# Patient Record
Sex: Male | Born: 1980 | Race: Black or African American | Hispanic: No | Marital: Single | Smoking: Never smoker
Health system: Southern US, Community
[De-identification: ages and names within clinical notes are randomized; demographics above are authoritative.]

## PROBLEM LIST (undated history)

## (undated) HISTORY — PX: ACHILLES TENDON SURGERY: SHX542

---

## 2003-02-18 ENCOUNTER — Emergency Department (HOSPITAL_COMMUNITY): Admission: EM | Admit: 2003-02-18 | Discharge: 2003-02-18 | Payer: Self-pay | Admitting: Emergency Medicine

## 2003-02-18 ENCOUNTER — Encounter: Payer: Self-pay | Admitting: Emergency Medicine

## 2003-06-02 ENCOUNTER — Emergency Department (HOSPITAL_COMMUNITY): Admission: EM | Admit: 2003-06-02 | Discharge: 2003-06-02 | Payer: Self-pay | Admitting: Emergency Medicine

## 2004-05-23 ENCOUNTER — Emergency Department (HOSPITAL_COMMUNITY): Admission: EM | Admit: 2004-05-23 | Discharge: 2004-05-24 | Payer: Self-pay | Admitting: *Deleted

## 2006-03-22 IMAGING — CR DG ANKLE COMPLETE 3+V*L*
3 series · 3 of 3 positions shown · non-contrast
Comparison: none

CLINICAL DATA: Twisted ankle with persistent pain.  
 LEFT ANKLE (THREE VIEWS)
 No evidence of fracture or dislocation.  Old healed distal tibial fracture is evident.  
 IMPRESSION
 1.  No acute finding.

[view not recorded (1 of 3)]
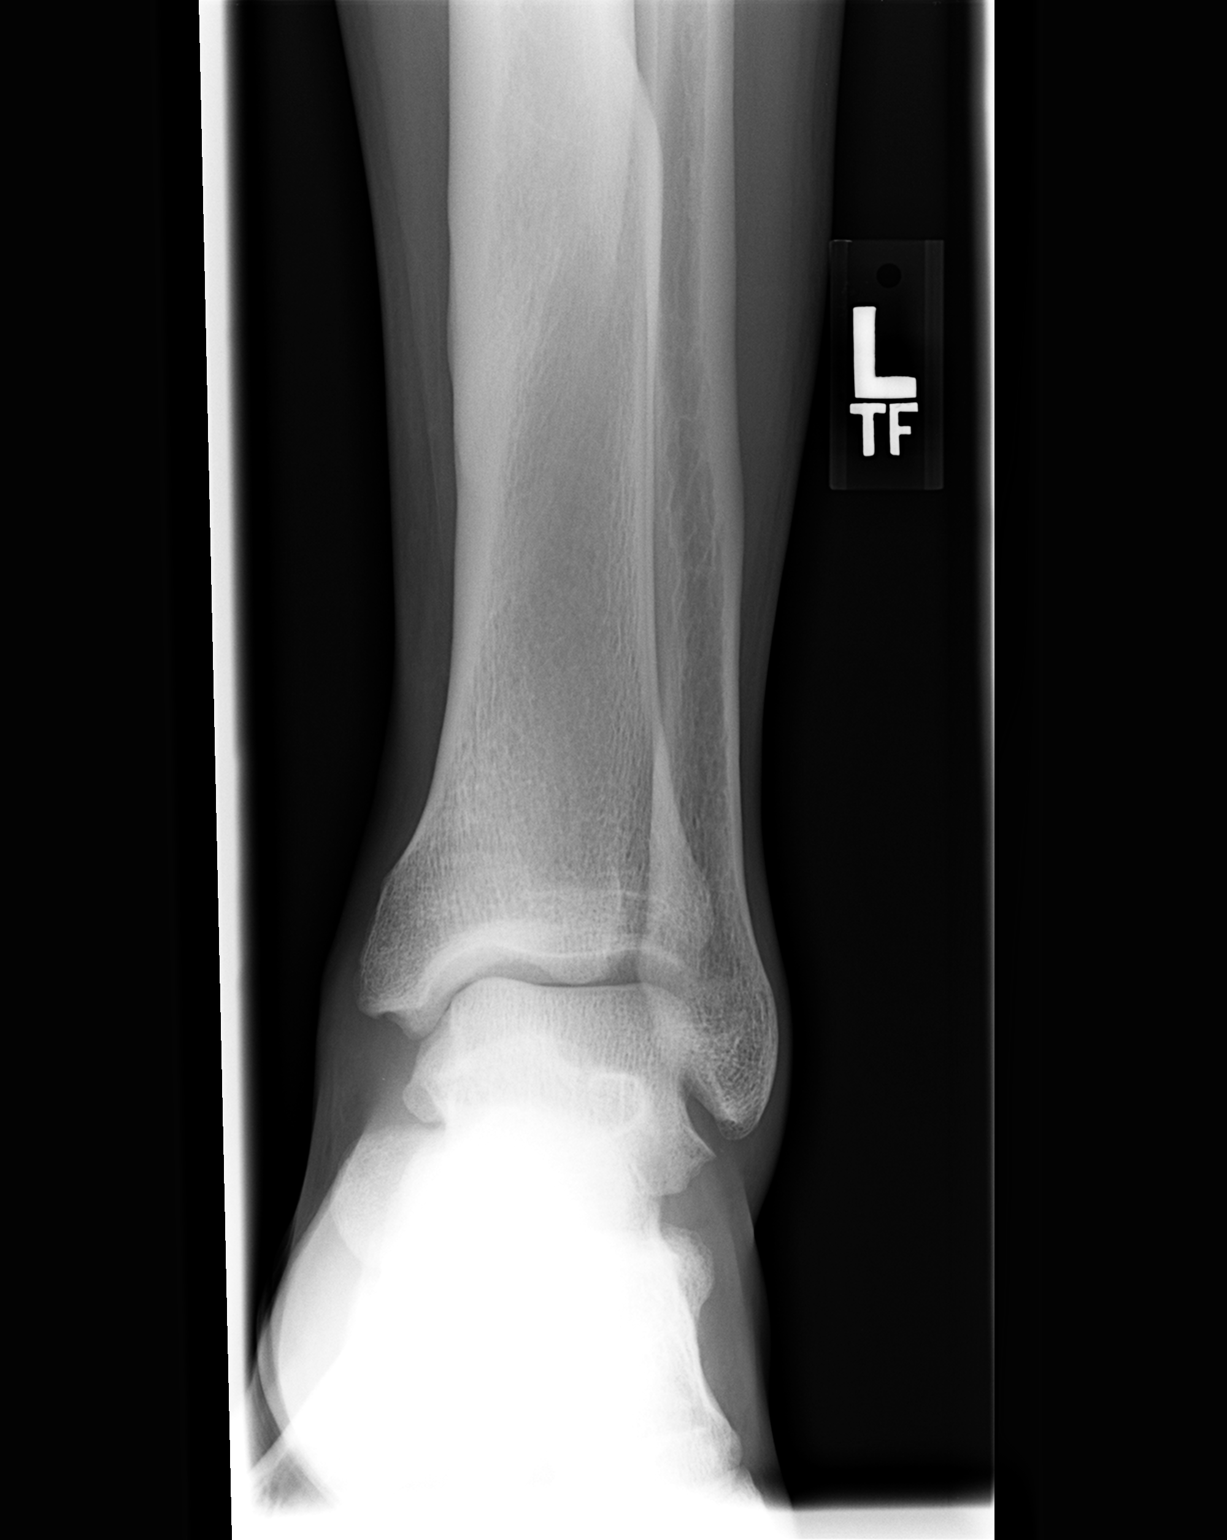

[view not recorded (2 of 3)]
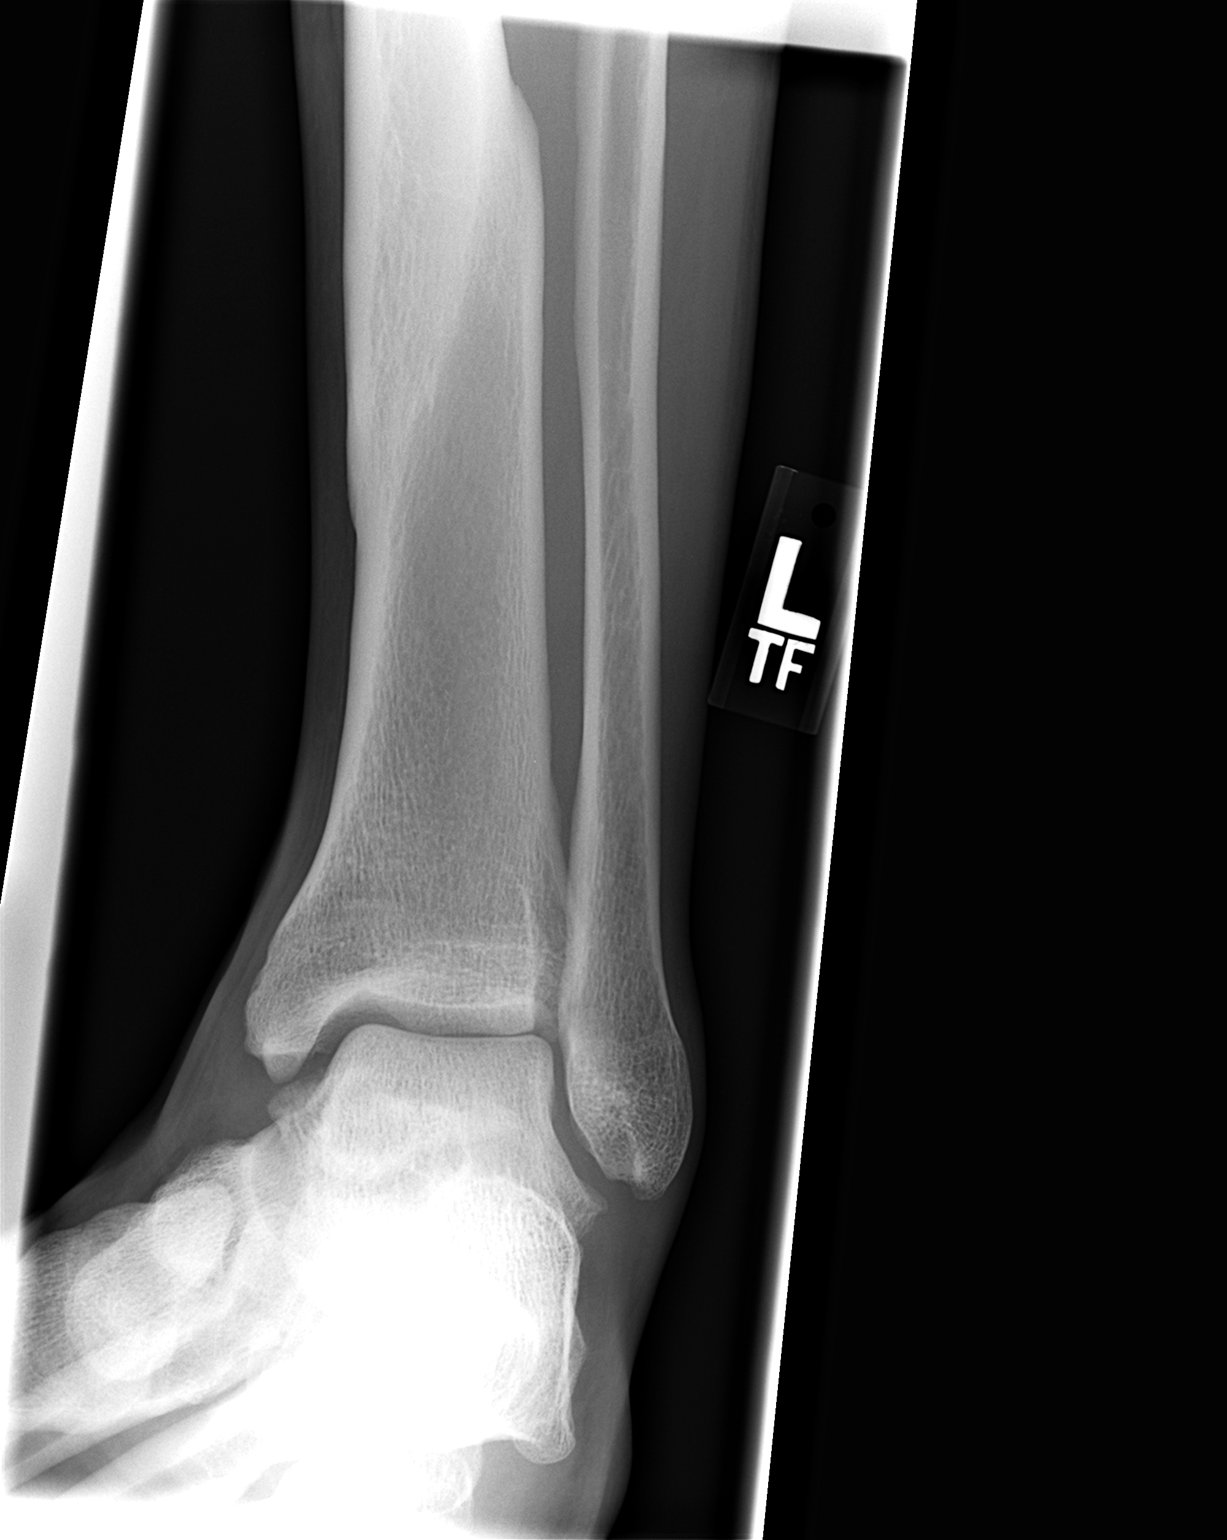

[view not recorded (3 of 3)]
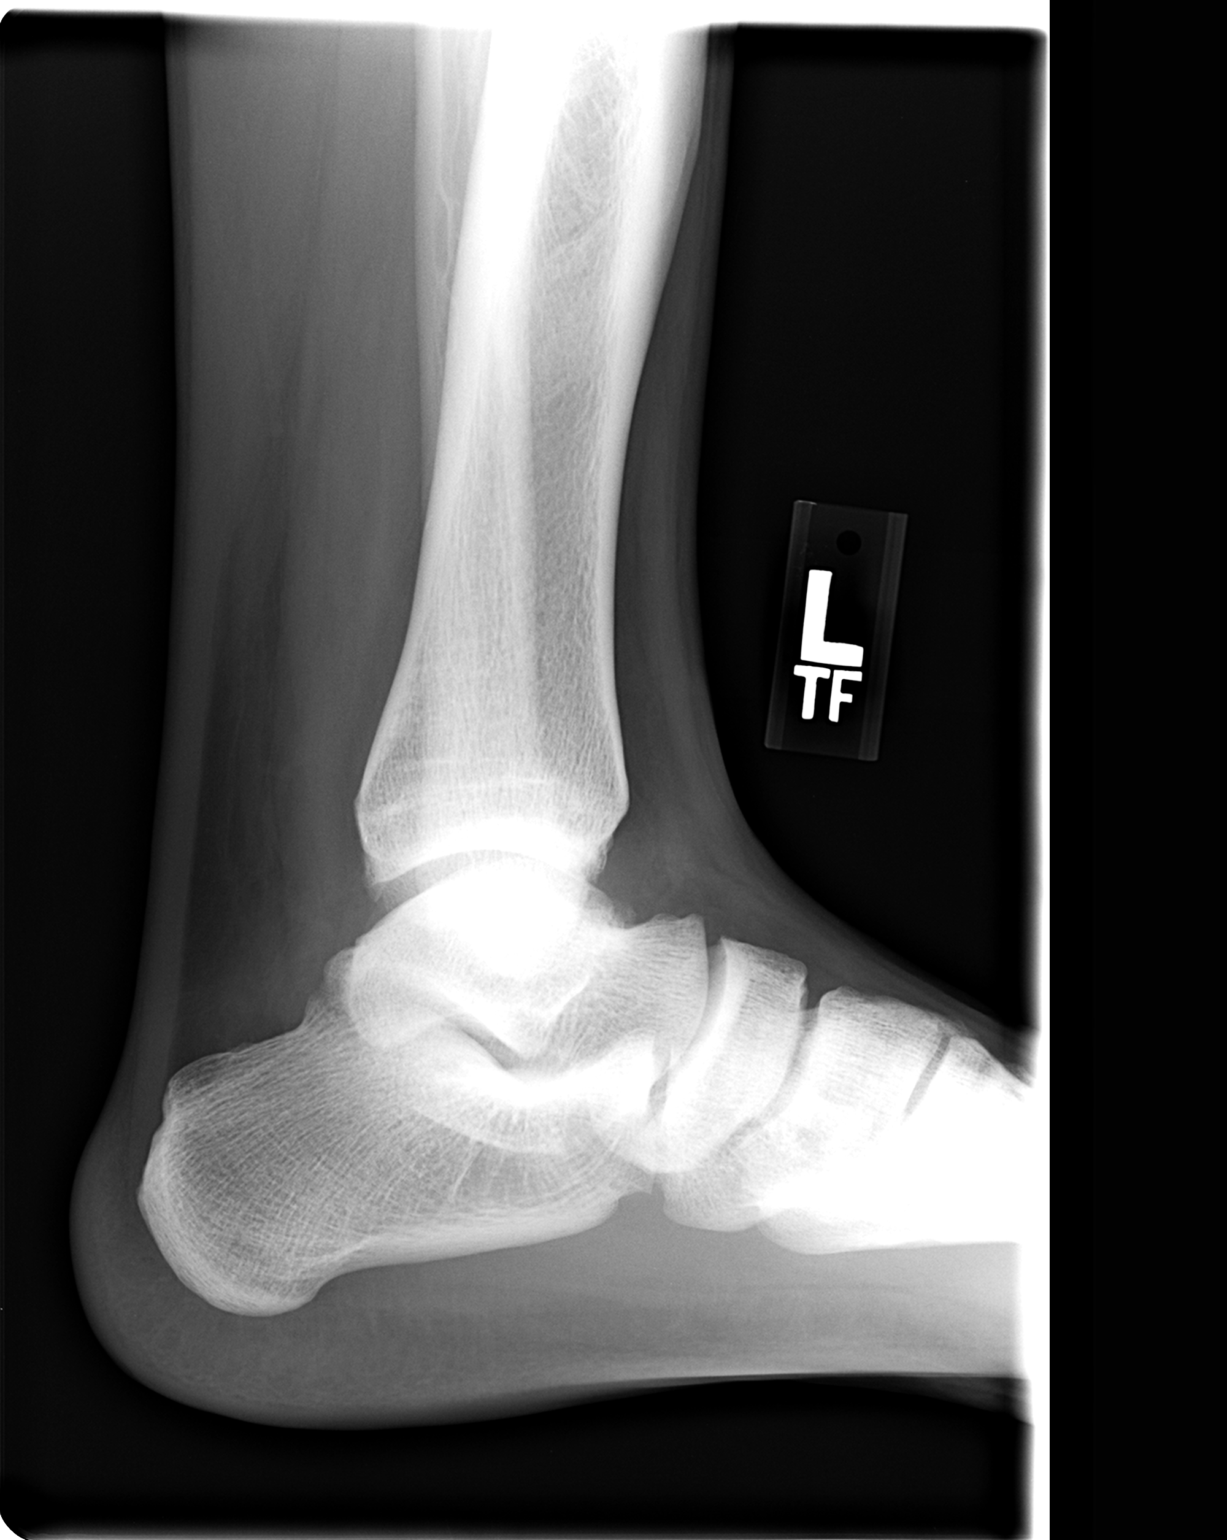

[3 of 3 positions shown; findings below may reference images not displayed]

## 2019-12-09 ENCOUNTER — Ambulatory Visit: Payer: Self-pay | Attending: Internal Medicine

## 2019-12-09 DIAGNOSIS — Z23 Encounter for immunization: Secondary | ICD-10-CM

## 2019-12-09 NOTE — Progress Notes (Signed)
   Covid-19 Vaccination Clinic  Name:  Jared Howe    MRN: [id number] DOB: [birth year]  12/09/2019  Jared Howe was observed post Covid-19 immunization for 15 minutes without incident. He was provided with Vaccine Information Sheet and instruction to access the V-Safe system.   Jared Howe was instructed to call 911 with any severe reactions post vaccine: Marland Kitchen Difficulty breathing  . Swelling of face and throat  . A fast heartbeat  . A bad rash all over body  . Dizziness and weakness   Immunizations Administered    Name Date Dose VIS Date Route   Pfizer COVID-19 Vaccine 12/09/2019  1:57 PM 0.3 mL 09/08/2019 Intramuscular   Manufacturer: [employer], [employer]   Lot: [id number]   NDC: [address]-3

## 2020-01-01 ENCOUNTER — Ambulatory Visit: Payer: Self-pay | Attending: Internal Medicine

## 2020-01-01 DIAGNOSIS — Z23 Encounter for immunization: Secondary | ICD-10-CM

## 2020-01-01 NOTE — Progress Notes (Signed)
   Covid-19 Vaccination Clinic  Name:  MANUELITO POAGE    MRN: [id number] DOB: Feb 10, 1981  01/01/2020  Mr. Tippin was observed post Covid-19 immunization for 15 minutes without incident. He was provided with Vaccine Information Sheet and instruction to access the V-Safe system.   Mr. Verrilli was instructed to call 911 with any severe reactions post vaccine: Marland Kitchen Difficulty breathing  . Swelling of face and throat  . A fast heartbeat  . A bad rash all over body  . Dizziness and weakness   Immunizations Administered    Name Date Dose VIS Date Route   Pfizer COVID-19 Vaccine 01/01/2020  5:04 PM 0.3 mL 09/08/2019 Intramuscular   Manufacturer: [employer], [employer]   Lot: [id number]   NDC: [address]-0

## 2020-01-02 ENCOUNTER — Ambulatory Visit: Payer: Self-pay

## 2020-07-02 ENCOUNTER — Ambulatory Visit
Admission: EM | Admit: 2020-07-02 | Discharge: 2020-07-02 | Disposition: A | Discharge status: Home / Self Care | Payer: PRIVATE HEALTH INSURANCE

## 2020-07-02 ENCOUNTER — Other Ambulatory Visit: Payer: Self-pay

## 2020-07-02 ENCOUNTER — Encounter: Payer: Self-pay | Admitting: Emergency Medicine

## 2020-07-02 DIAGNOSIS — L84 Corns and callosities: Secondary | ICD-10-CM | POA: Diagnosis not present

## 2020-07-02 NOTE — Discharge Instructions (Addendum)
Soak your hand or foot in warm soapy water to soften callus This can make it easier to remove the thickened skin. May apply freeze of compound from [facility] Follow-up with PCP Return or go to ED for worsening of symptoms

## 2020-07-02 NOTE — ED Provider Notes (Signed)
[facility]   [id number] 07/02/20 Arrival Time: 1307   Chief Complaint  Patient presents with  . Foot Pain     SUBJECTIVE: History from: patient.  JDYN PARKERSON is a 39 y.o. male who presented to the urgent care with a complaint of right second and third toe callus for the past few weeks.  Denies any precipitating event.  He localizes the pain to the the right second and third toe.  He describes the pain as constant and achy.  He has tried OTC medications without relief.  His symptoms are made worse with ROM.  He denies similar symptoms in the past.  Denies chills, fever, nausea, vomiting, diarrhea   ROS: As per HPI.  All other pertinent ROS negative.     History reviewed. No pertinent past medical history. Past Surgical History:  Procedure Laterality Date  . ACHILLES TENDON SURGERY Right    No Known Allergies No current facility-administered medications on file prior to encounter.   No current outpatient medications on file prior to encounter.   Social History   Socioeconomic History  . Marital status: Single    Spouse name: Not on file  . Number of children: Not on file  . Years of education: Not on file  . Highest education level: Not on file  Occupational History  . Not on file  Tobacco Use  . Smoking status: Never Smoker  . Smokeless tobacco: Never Used  Substance and Sexual Activity  . Alcohol use: Yes    Comment: occ  . Drug use: Never  . Sexual activity: Not on file  Other Topics Concern  . Not on file  Social History Narrative  . Not on file   Social Determinants of Health   Financial Resource Strain:   . Difficulty of Paying Living Expenses: Not on file  Food Insecurity:   . Worried About Programme researcher, broadcasting/film/video in the Last Year: Not on file  . Ran Out of Food in the Last Year: Not on file  Transportation Needs:   . Lack of Transportation (Medical): Not on file  . Lack of Transportation (Non-Medical): Not on file  Physical  Activity:   . Days of Exercise per Week: Not on file  . Minutes of Exercise per Session: Not on file  Stress:   . Feeling of Stress : Not on file  Social Connections:   . Frequency of Communication with Friends and Family: Not on file  . Frequency of Social Gatherings with Friends and Family: Not on file  . Attends Religious Services: Not on file  . Active Member of Clubs or Organizations: Not on file  . Attends Banker Meetings: Not on file  . Marital Status: Not on file  Intimate Partner Violence:   . Fear of Current or Ex-Partner: Not on file  . Emotionally Abused: Not on file  . Physically Abused: Not on file  . Sexually Abused: Not on file   No family history on file.  OBJECTIVE:  Vitals:   07/02/20 1341 07/02/20 1343  BP:  (!) 151/83  Pulse:  60  Resp:  19  Temp:  98.3 F (36.8 C)  TempSrc:  Oral  SpO2:  98%  Weight: 137 lb (62.1 kg)   Height: 6' (1.829 m)      Physical Exam Vitals and nursing note reviewed.  Constitutional:      General: He is not in acute distress.    Appearance: Normal appearance. He is normal weight.  He is not ill-appearing, toxic-appearing or diaphoretic.  Cardiovascular:     Rate and Rhythm: Normal rate and regular rhythm.     Pulses: Normal pulses.     Heart sounds: Normal heart sounds. No murmur heard.  No friction rub. No gallop.   Pulmonary:     Effort: Pulmonary effort is normal. No respiratory distress.     Breath sounds: Normal breath sounds. No stridor. No wheezing, rhonchi or rales.  Chest:     Chest wall: No tenderness.  Musculoskeletal:        General: Tenderness present.       Feet:  Feet:     Right foot:     Skin integrity: Callus present.     Comments: Callus present between the right second and third toe Neurological:     Mental Status: He is alert and oriented to person, place, and time.     LABS:  No results found for this or any previous visit (from the past 24 hour(s)).   ASSESSMENT &  PLAN:  1. Callus of foot     No orders of the defined types were placed in this encounter.   Discharge instructions    Soak your hand or foot in warm soapy water to soften callus This can make it easier to remove the thickened skin. May apply freeze of compound from [facility] Follow-up with PCP Return or go to ED for worsening of symptoms  reviewed expectations re: course of current medical issues. Questions answered. Outlined signs and symptoms indicating need for more acute intervention. Patient verbalized understanding. After Visit Summary given.         Durward Parcel, FNP 07/02/20 1419

## 2020-07-02 NOTE — ED Triage Notes (Signed)
Pain in between 2nd and 3rd toe. There is a hardened raised area in that location, pt reports he runs a lot.

## 2020-09-11 ENCOUNTER — Encounter: Payer: Self-pay | Admitting: Orthopedic Surgery

## 2020-09-11 ENCOUNTER — Other Ambulatory Visit: Payer: Self-pay

## 2020-09-11 ENCOUNTER — Ambulatory Visit: Payer: PRIVATE HEALTH INSURANCE

## 2020-09-11 ENCOUNTER — Ambulatory Visit (INDEPENDENT_AMBULATORY_CARE_PROVIDER_SITE_OTHER): Payer: PRIVATE HEALTH INSURANCE | Admitting: Orthopedic Surgery

## 2020-09-11 VITALS — BP 147/91 | HR 82 | Ht 72.0 in | Wt 242.0 lb

## 2020-09-11 DIAGNOSIS — M25512 Pain in left shoulder: Secondary | ICD-10-CM | POA: Diagnosis not present

## 2020-09-11 DIAGNOSIS — M62838 Other muscle spasm: Secondary | ICD-10-CM | POA: Diagnosis not present

## 2020-09-11 NOTE — Progress Notes (Signed)
New Patient Visit  Assessment: Jared Howe is a 39 y.o. male with the following: 1.  Left sided cervical paraspinous muscle spasm; with radiculopathy  Plan: Jared Howe has had left-sided neck pain for the last 7-10 days.  He states he woke up 1 morning, with pain in the left side of his neck.  He also has some radiating pains into his left shoulder and distal to his small finger.  I have advised him to continue to work on stretching, using heat to loosen up his muscles, and followed by ice as needed.  We discussed the potential that he has irritated the nerves in his neck, and possibly sustained a herniated disc.  However, at this time, it is too early to say definitively as it could simply be a muscle spasm.  He can continue with his home exercises, medications and chiropractic care.  I do anticipate that his pain will continue to improve, and most of his symptoms will resolve in the coming weeks.  If his symptoms worsen, or persist for several months, he can return to clinic, and we can consider obtaining an MRI.  Follow-up: No follow-ups on file.  Subjective:  Chief Complaint  Patient presents with  . Shoulder Pain    Left shoulder, still having some pain, was told he had a pinched nerve.     History of Present Illness: Jared Howe is a 39 y.o. male who presents for evaluation of left shoulder pain.  He states he woke up approximately 7-10 days ago and has neck pain.  This is resulted in some radiating pains in his left shoulder and distal into his left hand.  Since the onset of his pain, he has been evaluated by a chiropractor.  Does feel as though his range of motion is slightly improving.  He has not worked with physical therapist, but he has some exercises that he can work on at home.  He has been taking some over-the-counter pain medications.   Review of Systems: No fevers or chills No numbness or tingling No chest pain No shortness of breath No bowel or  bladder dysfunction No GI distress No headaches   Medical History:  No past medical history on file.  Past Surgical History:  Procedure Laterality Date  . ACHILLES TENDON SURGERY Right     No family history on file. Social History   Tobacco Use  . Smoking status: Never Smoker  . Smokeless tobacco: Never Used  Substance Use Topics  . Alcohol use: Yes    Comment: occ  . Drug use: Never    No Known Allergies  No outpatient medications have been marked as taking for the 09/11/20 encounter (Office Visit) with Oliver Barre, MD.    Objective: BP (!) 147/91   Pulse 82   Ht 6' (1.829 m)   Wt 242 lb (109.8 kg)   BMI 32.82 kg/m   Physical Exam:  General: Alert and oriented, no acute distress Gait: Normal  Evaluation of the left shoulder demonstrates no obvious deformity.  He has full range of motion, and 5/5 strength throughout.  He does have some tenderness within the left side of his neck and trapezius muscle.  This is mildly tender to palpation.  He notes a pulling sensation with rotation to his right.  The sensation is not the same when he rotates his neck to the left.  He does note some tightness when flexing and extending his neck.  His radiating symptoms do not worsen  with neck motion.    IMAGING: I personally ordered and reviewed the following images  X-rays of the left shoulder were obtained in clinic today and demonstrates no acute injury.  There is no evidence of proximal humeral migration.  The glenohumeral joint space is well-maintained.  Impression: Normal left shoulder x-ray New Medications:  No orders of the defined types were placed in this encounter.     Oliver Barre, MD  09/11/2020 10:39 PM

## 2020-09-26 ENCOUNTER — Ambulatory Visit: Payer: PRIVATE HEALTH INSURANCE | Admitting: Orthopedic Surgery

## 2022-05-12 ENCOUNTER — Ambulatory Visit (INDEPENDENT_AMBULATORY_CARE_PROVIDER_SITE_OTHER): Payer: PRIVATE HEALTH INSURANCE | Admitting: Orthopedic Surgery

## 2022-05-12 ENCOUNTER — Encounter: Payer: Self-pay | Admitting: Orthopedic Surgery

## 2022-05-12 VITALS — Ht 72.0 in | Wt 235.0 lb

## 2022-05-12 DIAGNOSIS — M25512 Pain in left shoulder: Secondary | ICD-10-CM

## 2022-05-12 NOTE — Progress Notes (Signed)
Orthopaedic Clinic Return  Assessment: Jared Howe is a 41 y.o. male with the following: Left shoulder pain; no injury  Plan: Jared Howe has pain in his left shoulder.  Atraumatic onset.  This is preventing him from certain activities.  He is not taking medications on a regular basis.  He has a good strength, and good range of motion overall.  Low concern for a severe injury.  Recommended return to activities.  Medications as needed, and consider a period of regular dosing.  If no improvement, can consider physical therapy.  Overall reassuring.  Follow-up: Return if symptoms worsen or fail to improve.   Subjective:  Chief Complaint  Patient presents with   Shoulder Pain    Lt shoulder pain that varies for 3 wks. NKI and is different from pain at last visit.     History of Present Illness: Jared Howe is a 41 y.o. male who returns to clinic for evaluation of shoulder pain.  He states he has had pain in the superior and anterior aspect of the left shoulder for the past 3 weeks.  No specific injury.  He does note that his daughter jumped on his shoulder 1 time, and he started to have some pain and clicking in the left shoulder.  He takes medications occasionally.  He states he has taken it maybe once every 3 days or so.  No prior physical therapy.  No prior injury to his left shoulder.  He has not had an injection.  Review of Systems: No fevers or chills No numbness or tingling No chest pain No shortness of breath No bowel or bladder dysfunction No GI distress No headaches   Objective: Ht 6' (1.829 m)   Wt 235 lb (106.6 kg)   BMI 31.87 kg/m   Physical Exam:  Alert and oriented.  No acute distress.  Left shoulder without deformity.  Well-developed male.  Mild tenderness palpation in the anterior shoulder.  Negative O'Brien's.  Near full forward flexion, abduction and internal rotation, with some obvious discomfort in the left shoulder.  Good strength in the  left shoulder.  Fingers are warm and well-perfused.  IMAGING: I personally ordered and reviewed the following images:  No new imaging obtained today.   Oliver Barre, MD 05/12/2022 1:59 PM

## 2022-11-18 ENCOUNTER — Ambulatory Visit: Payer: Self-pay

## 2023-06-22 ENCOUNTER — Encounter (HOSPITAL_BASED_OUTPATIENT_CLINIC_OR_DEPARTMENT_OTHER): Payer: Self-pay | Admitting: Family Medicine

## 2023-06-22 ENCOUNTER — Ambulatory Visit (HOSPITAL_BASED_OUTPATIENT_CLINIC_OR_DEPARTMENT_OTHER): Payer: 59 | Admitting: Family Medicine

## 2023-06-22 VITALS — BP 170/101 | HR 59 | Ht 72.0 in | Wt 244.9 lb

## 2023-06-22 DIAGNOSIS — R030 Elevated blood-pressure reading, without diagnosis of hypertension: Secondary | ICD-10-CM | POA: Diagnosis not present

## 2023-06-22 DIAGNOSIS — Z23 Encounter for immunization: Secondary | ICD-10-CM | POA: Diagnosis not present

## 2023-06-22 DIAGNOSIS — Z0000 Encounter for general adult medical examination without abnormal findings: Secondary | ICD-10-CM | POA: Diagnosis not present

## 2023-06-22 DIAGNOSIS — Z7689 Persons encountering health services in other specified circumstances: Secondary | ICD-10-CM | POA: Diagnosis not present

## 2023-06-22 NOTE — Patient Instructions (Addendum)
[facility] [phone]

## 2023-06-22 NOTE — Assessment & Plan Note (Signed)
Routine HCM labs ordered. Will obtain labs today and update patient with results.  Review of PMH, FH, SH, medications and HM performed. Preventative care hand-out provided.  Recommend healthy diet.  Recommend approximately 150 minutes/week of moderate intensity exercise. Recommend regular dental and vision exams. Always use seatbelt/lap and shoulder restraints. Recommend using smoke alarms and checking batteries at least twice a year. Recommend using sunscreen when outside. Discussed immunization recommendations for tetanus vaccine. Patient agreed to proceed with this today. Patient declines influenza vaccine today.

## 2023-06-22 NOTE — Assessment & Plan Note (Signed)
Patient is a 42 year-old male who presents today to establish care with primary care at [facility]. Reviewed the past medical history, family history, social history, surgical history, medications and allergies today- updates made as indicated. Patient has no concerns today.

## 2023-06-22 NOTE — Progress Notes (Signed)
Complete physical exam  Patient: Jared Howe   DOB: 07-09-1981   42 y.o. Male  MRN: [id number]  Subjective:    ARNOL COPPEDGE is a 42 y.o. male who presents today for a complete physical exam. He reports consuming a general diet. Gym/ health club routine includes cardio and mod to heavy weightlifting. He runs about 10-12 miles per week. He is having some joint pain in his left foot & ankle and would like referral to a specialist. He generally feels well. He reports sleeping well. He does not have additional problems to discuss today. Unsure about family history.   Former PCP: [facility], can see records  Last physical: 11/13/2021  History of HLD Denies diagnosis of HTN or elevated BPs    Depression screenings:    06/22/2023    9:20 AM  Depression screen PHQ 2/9  Decreased Interest 0  Down, Depressed, Hopeless 0  PHQ - 2 Score 0   Vision- 2 weeks ago 05/2023 Dentist- 05/2023, routine cleaning   Patient Care Team: Alyson Reedy, FNP as PCP - General (Family Medicine)   No outpatient medications prior to visit.   No facility-administered medications prior to visit.    Review of Systems  Constitutional:  Negative for malaise/fatigue.  HENT:  Negative for ear pain and tinnitus.   Eyes:  Negative for blurred vision and double vision.  Respiratory:  Negative for cough and shortness of breath.   Cardiovascular:  Negative for chest pain, palpitations and leg swelling.  Gastrointestinal:  Negative for abdominal pain, nausea and vomiting.  Musculoskeletal:  Negative for myalgias.  Neurological:  Negative for dizziness, weakness and headaches.  Psychiatric/Behavioral:  Negative for depression and suicidal ideas. The patient is not nervous/anxious and does not have insomnia.        Objective:     BP (!) 170/101 Comment: Repeat BP  Pulse (!) 59   Ht 6' (1.829 m)   Wt 244 lb 14.4 oz (111.1 kg)   SpO2 100%   BMI 33.21 kg/m  BP Readings from Last 3 Encounters:   06/22/23 (!) 170/101  09/11/20 (!) 147/91  07/02/20 (!) 151/83    Physical Exam Constitutional:      Appearance: Normal appearance.  HENT:     Head: Normocephalic.     Right Ear: Tympanic membrane, ear canal and external ear normal.     Left Ear: Tympanic membrane, ear canal and external ear normal.     Mouth/Throat:     Mouth: Mucous membranes are moist.     Pharynx: Oropharynx is clear.  Eyes:     Extraocular Movements: Extraocular movements intact.     Pupils: Pupils are equal, round, and reactive to light.  Cardiovascular:     Rate and Rhythm: Normal rate and regular rhythm.     Pulses: Normal pulses.     Heart sounds: Normal heart sounds.  Pulmonary:     Effort: Pulmonary effort is normal.     Breath sounds: Normal breath sounds.  Abdominal:     General: Abdomen is flat. Bowel sounds are normal.     Palpations: Abdomen is soft.  Musculoskeletal:        General: Normal range of motion.     Cervical back: Normal range of motion.  Skin:    General: Skin is warm and dry.  Neurological:     Mental Status: He is alert.  Psychiatric:        Mood and Affect: Mood normal.  Behavior: Behavior normal.        Thought Content: Thought content normal.        Judgment: Judgment normal.         Assessment & Plan:    Routine Health Maintenance and Physical Exam  Health Maintenance  Topic Date Due   HIV Screening  Never done   Hepatitis C Screening  Never done   DTaP/Tdap/Td vaccine (1 - Tdap) Never done   COVID-19 Vaccine (4 - 2023-24 season) 07/08/2023*   Flu Shot  08/29/2023*   HPV Vaccine  Aged Out  *Topic was postponed. The date shown is not the original due date.    Wellness examination Assessment & Plan: Routine HCM labs ordered. Will obtain labs today and update patient with results.  Review of PMH, FH, SH, medications and HM performed. Preventative care hand-out provided.  Recommend healthy diet.  Recommend approximately 150 minutes/week of moderate  intensity exercise. Recommend regular dental and vision exams. Always use seatbelt/lap and shoulder restraints. Recommend using smoke alarms and checking batteries at least twice a year. Recommend using sunscreen when outside. Discussed immunization recommendations for tetanus vaccine. Patient agreed to proceed with this today. Patient declines influenza vaccine today.  Orders: -     CBC with Differential/Platelet -     Comprehensive metabolic panel -     Hemoglobin A1c -     Lipid panel -     TSH Rfx on Abnormal to Free T4 -     RPR+HIV+GC+CT Panel -     HCV RNA quant rflx ultra or genotyp  Encounter to establish care Assessment & Plan: Patient is a 42 year-old male who presents today to establish care with primary care at [facility]. Reviewed the past medical history, family history, social history, surgical history, medications and allergies today- updates made as indicated. Patient has no concerns today.     Elevated blood pressure reading in office without diagnosis of hypertension Assessment & Plan: Patient presents today with elevated blood pressure, no improvement with recheck. Patient in no acute distress and is well-appearing. Denies chest pain, shortness of breath, lower extremity edema, vision changes, headaches. Cardiovascular exam with heart regular rate and rhythm. Normal heart sounds, no murmurs present. No lower extremity edema present. Lungs clear to auscultation bilaterally. Patient does not have a history of HTN and does not appear to have had readings this high in the past. Discussed when to seek emergency care. Advised patient to closely monitor blood pressure at home. Return to office sooner if blood pressure begins to increase greater than 130/80. Follow-up in 2 weeks.          Return in about 2 weeks (around 07/06/2023) for HTN follow-up.     Alyson Reedy, FNP

## 2023-06-22 NOTE — Assessment & Plan Note (Signed)
Patient presents today with elevated blood pressure, no improvement with recheck. Patient in no acute distress and is well-appearing. Denies chest pain, shortness of breath, lower extremity edema, vision changes, headaches. Cardiovascular exam with heart regular rate and rhythm. Normal heart sounds, no murmurs present. No lower extremity edema present. Lungs clear to auscultation bilaterally. Patient does not have a history of HTN and does not appear to have had readings this high in the past. Discussed when to seek emergency care. Advised patient to closely monitor blood pressure at home. Return to office sooner if blood pressure begins to increase greater than 130/80. Follow-up in 2 weeks.

## 2023-06-25 LAB — RPR+HIV+GC+CT PANEL
Chlamydia trachomatis, NAA: NEGATIVE
HIV Screen 4th Generation wRfx: NONREACTIVE
Neisseria Gonorrhoeae by PCR: NEGATIVE
RPR Ser Ql: NONREACTIVE

## 2023-06-25 LAB — COMPREHENSIVE METABOLIC PANEL
ALT: 44 [IU]/L (ref 0–44)
AST: 25 [IU]/L (ref 0–40)
Albumin: 4.3 g/dL (ref 4.1–5.1)
Alkaline Phosphatase: 59 [IU]/L (ref 44–121)
BUN/Creatinine Ratio: 14 (ref 9–20)
BUN: 16 mg/dL (ref 6–24)
Bilirubin Total: 0.5 mg/dL (ref 0.0–1.2)
CO2: 23 mmol/L (ref 20–29)
Calcium: 9.7 mg/dL (ref 8.7–10.2)
Chloride: 104 mmol/L (ref 96–106)
Creatinine, Ser: 1.12 mg/dL (ref 0.76–1.27)
Globulin, Total: 3 g/dL (ref 1.5–4.5)
Glucose: 120 mg/dL — ABNORMAL HIGH (ref 70–99)
Potassium: 4.5 mmol/L (ref 3.5–5.2)
Sodium: 138 mmol/L (ref 134–144)
Total Protein: 7.3 g/dL (ref 6.0–8.5)
eGFR: 85 mL/min/{1.73_m2} (ref >59)

## 2023-06-25 LAB — CBC WITH DIFFERENTIAL/PLATELET
Basophils Absolute: 0 10*3/uL (ref 0.0–0.2)
Basos: 1 %
EOS (ABSOLUTE): 0.1 10*3/uL (ref 0.0–0.4)
Eos: 1 %
Hematocrit: 45.2 % (ref 37.5–51.0)
Hemoglobin: 15.1 g/dL (ref 13.0–17.7)
Immature Grans (Abs): 0 10*3/uL (ref 0.0–0.1)
Immature Granulocytes: 0 %
Lymphocytes Absolute: 2.5 10*3/uL (ref 0.7–3.1)
Lymphs: 45 %
MCH: 29.5 pg (ref 26.6–33.0)
MCHC: 33.4 g/dL (ref 31.5–35.7)
MCV: 89 fL (ref 79–97)
Monocytes Absolute: 0.4 10*3/uL (ref 0.1–0.9)
Monocytes: 7 %
Neutrophils Absolute: 2.5 10*3/uL (ref 1.4–7.0)
Neutrophils: 46 %
Platelets: 345 10*3/uL (ref 150–450)
RBC: 5.11 x10E6/uL (ref 4.14–5.80)
RDW: 14 % (ref 11.6–15.4)
WBC: 5.5 10*3/uL (ref 3.4–10.8)

## 2023-06-25 LAB — HCV RNA QUANT RFLX ULTRA OR GENOTYP: HCV Quant Baseline: NOT DETECTED [IU]/mL

## 2023-06-25 LAB — HEMOGLOBIN A1C
Est. average glucose Bld gHb Est-mCnc: 126 mg/dL
Hgb A1c MFr Bld: 6 % — ABNORMAL HIGH (ref 4.8–5.6)

## 2023-06-25 LAB — LIPID PANEL
Chol/HDL Ratio: 4.6 {ratio} (ref 0.0–5.0)
Cholesterol, Total: 194 mg/dL (ref 100–199)
HDL: 42 mg/dL (ref >39)
LDL Chol Calc (NIH): 129 mg/dL — ABNORMAL HIGH (ref 0–99)
Triglycerides: 126 mg/dL (ref 0–149)
VLDL Cholesterol Cal: 23 mg/dL (ref 5–40)

## 2023-06-25 LAB — TSH RFX ON ABNORMAL TO FREE T4: TSH: 1.04 u[IU]/mL (ref 0.450–4.500)

## 2023-07-06 ENCOUNTER — Ambulatory Visit (HOSPITAL_BASED_OUTPATIENT_CLINIC_OR_DEPARTMENT_OTHER): Payer: PRIVATE HEALTH INSURANCE | Admitting: Family Medicine

## 2023-07-13 ENCOUNTER — Ambulatory Visit (INDEPENDENT_AMBULATORY_CARE_PROVIDER_SITE_OTHER): Payer: 59 | Admitting: Family Medicine

## 2023-07-13 ENCOUNTER — Encounter (HOSPITAL_BASED_OUTPATIENT_CLINIC_OR_DEPARTMENT_OTHER): Payer: Self-pay | Admitting: Family Medicine

## 2023-07-13 VITALS — BP 162/92 | HR 76 | Ht 72.0 in | Wt 239.9 lb

## 2023-07-13 DIAGNOSIS — I10 Essential (primary) hypertension: Secondary | ICD-10-CM | POA: Insufficient documentation

## 2023-07-13 DIAGNOSIS — J029 Acute pharyngitis, unspecified: Secondary | ICD-10-CM | POA: Diagnosis not present

## 2023-07-13 DIAGNOSIS — Z113 Encounter for screening for infections with a predominantly sexual mode of transmission: Secondary | ICD-10-CM | POA: Diagnosis not present

## 2023-07-13 LAB — POC COVID19 BINAXNOW: SARS Coronavirus 2 Ag: POSITIVE — AB

## 2023-07-13 LAB — POCT INFLUENZA A/B
Influenza A, POC: NEGATIVE
Influenza B, POC: NEGATIVE

## 2023-07-13 LAB — POCT RAPID STREP A (OFFICE): Rapid Strep A Screen: NEGATIVE

## 2023-07-13 NOTE — Progress Notes (Signed)
Established Patient Office Visit  Subjective   Patient ID: Jared Howe, male    DOB: November 19, 1980  Age: 42 y.o. MRN: [id number]  Chief Complaint  Patient presents with   Sore Throat    Started about 2 days ago, just came back from a [place]. Has chills   Hypertension   HYPERTENSION: Jared Howe is a 42 year old male patient who presents for the management of hypertension and also reports he has a sore throat. He reports noticing stomach pains and chills last night. Reports he noticed a sore throat this morning. Denies N/V, abdominal pain, myalgias, chest pain, shob, dizziness, headache, vision changes.   He has been checking his blood pressures at home regularly.  10/1: 130/89 AM & 155/83 PM 10/2: 163/98 & 146/88 10/3: 159/93 & 133/82 10/4: 160/95 & 134/82 10/5: 158/98 10/6: 160/100 10/7: 155/93 & 144/86 10/14: 153/87 Patient's current hypertension medication regimen is: no pharmacotherapy  Patient is regularly keeping a check on BP at home.  Has made various lifestyle changes, including drinking alcohol less often   BP Readings from Last 3 Encounters:  07/13/23 (!) 162/92  06/22/23 (!) 170/101  09/11/20 (!) 147/91   Review of Systems  Constitutional:  Positive for chills. Negative for fever and malaise/fatigue.  HENT:  Positive for sore throat. Negative for congestion.   Eyes:  Negative for blurred vision and double vision.  Respiratory:  Negative for cough, shortness of breath and wheezing.   Cardiovascular:  Negative for chest pain, palpitations and leg swelling.  Gastrointestinal:  Negative for abdominal pain, nausea and vomiting.  Musculoskeletal:  Negative for myalgias.  Neurological:  Negative for dizziness, weakness and headaches.  Psychiatric/Behavioral:  Negative for depression and suicidal ideas. The patient is not nervous/anxious and does not have insomnia.       Objective:     BP (!) 162/92   Pulse 76   Ht 6' (1.829 m)   Wt 239 lb  14.4 oz (108.8 kg)   SpO2 100%   BMI 32.54 kg/m  BP Readings from Last 3 Encounters:  07/13/23 (!) 162/92  06/22/23 (!) 170/101  09/11/20 (!) 147/91     Physical Exam Constitutional:      Appearance: Normal appearance. He is well-developed.  HENT:     Nose: No congestion or rhinorrhea.  Cardiovascular:     Rate and Rhythm: Normal rate and regular rhythm.     Pulses: Normal pulses.     Heart sounds: Normal heart sounds.  Pulmonary:     Effort: Pulmonary effort is normal.     Breath sounds: Normal breath sounds.  Musculoskeletal:     Right lower leg: No edema.     Left lower leg: No edema.  Neurological:     Mental Status: He is alert.  Psychiatric:        Mood and Affect: Mood normal.        Behavior: Behavior normal.     Assessment & Plan:   Primary hypertension Assessment & Plan: Patient presents today with elevated blood pressure, recheck still elevated. Patient in no acute distress and is well-appearing. Denies chest pain, shortness of breath, lower extremity edema, vision changes, headaches. Cardiovascular exam with heart regular rate and rhythm. Normal heart sounds, no murmurs present. No lower extremity edema present. Lungs clear to auscultation bilaterally. Patient is not currently taking pharmacotherapy. Patient prefers to focus on lifestyle management at this time, including healthy nutrition and daily exercise. Advised him to closely monitor blood pressure  at home and return to office sooner if blood pressure begins to increase greater than 130/80.     Sore throat Assessment & Plan: Patient POCT COVID positive today. Denies shob, chest pain, wheezing, cough, fever. Discussed emergency precautions. Advised patient to stay home and away from others until symptoms are improving and fever free for 24 hours without the use of tylenol or ibuprofen. Counseled patient to drink adequate fluids, honey may help ease cough symptoms, and use of Coricidin HBP to take for your  cough. Discussed symptomatic management, including OTC medications. Provided patient with work note.   Orders: -     POCT Influenza A/B -     POC COVID-19 BinaxNow -     POCT rapid strep A  Screen for sexually transmitted diseases -     RPR+HIV+GC+CT Panel     Return in about 2 months (around 09/12/2023) for HTN follow-up.    Alyson Reedy, FNP

## 2023-07-13 NOTE — Assessment & Plan Note (Signed)
Patient presents today with elevated blood pressure, recheck still elevated. Patient in no acute distress and is well-appearing. Denies chest pain, shortness of breath, lower extremity edema, vision changes, headaches. Cardiovascular exam with heart regular rate and rhythm. Normal heart sounds, no murmurs present. No lower extremity edema present. Lungs clear to auscultation bilaterally. Patient is not currently taking pharmacotherapy. Patient prefers to focus on lifestyle management at this time, including healthy nutrition and daily exercise. Advised him to closely monitor blood pressure at home and return to office sooner if blood pressure begins to increase greater than 130/80.

## 2023-07-13 NOTE — Patient Instructions (Signed)
COVID-19 positive:  Influenza positive:  Stay home and away from others until symptoms are improving and fever free for 24 hours without the use of tylenol or ibuprofen. If symptoms have improved and fever resolved, then can be in public setting, but should wear a mask around others, practice safe distancing, and perform hand hygiene for an additional 5 days. If symptoms worsen, or the patient develop shortness of breath, pulse oximeter reading of < 90%, or chest pain, seek immediate care at nearest emergency department or call 911.   Vitamin Regimen:  Vitamin C 500mg  twice daily  Vitamin D 5000 units once daily  Zinc 50-75mg  once daily   Over the counter Medications:  Aspirin 81mg  per day * unless allergic or contraindicated*  Use Tylenol (acetaminophen) for fever *unless allergic or contraindicated*    Non-Medication Therapy:  Drink plenty of fluids, warm if possible.   A teaspoon of honey may help ease coughing symptoms.   Cough drops or hard candy for coughing.   Over the Counter Medication Therapy:  Use a cough expectorant such as guaifenesin (Mucinex) if recommended by your doctor for a wet, congested cough. If you have high blood pressure, please ask your doctor first before using this.   Use a cough suppressant such as dextromethorphan (Robitussin/Delsym) for a dry cough. If you have high blood pressure, please ask your doctor first before using this.   If you have high blood pressure, medication such as Coricidin HBP is safe to take for your cough and will not increase your blood pressure.

## 2023-07-13 NOTE — Assessment & Plan Note (Addendum)
Patient POCT COVID positive today. Denies shob, chest pain, wheezing, cough, fever. Discussed emergency precautions. Advised patient to stay home and away from others until symptoms are improving and fever free for 24 hours without the use of tylenol or ibuprofen. Counseled patient to drink adequate fluids, honey may help ease cough symptoms, and use of Coricidin HBP to take for your cough. Discussed symptomatic management, including OTC medications. Provided patient with work note.

## 2023-07-15 LAB — RPR+HIV+GC+CT PANEL
Chlamydia trachomatis, NAA: NEGATIVE
HIV Screen 4th Generation wRfx: NONREACTIVE
Neisseria Gonorrhoeae by PCR: NEGATIVE
RPR Ser Ql: NONREACTIVE

## 2023-08-19 ENCOUNTER — Encounter (HOSPITAL_BASED_OUTPATIENT_CLINIC_OR_DEPARTMENT_OTHER): Payer: Self-pay | Admitting: Family Medicine

## 2023-09-13 ENCOUNTER — Encounter (HOSPITAL_BASED_OUTPATIENT_CLINIC_OR_DEPARTMENT_OTHER): Payer: Self-pay | Admitting: Family Medicine

## 2023-09-15 ENCOUNTER — Ambulatory Visit: Payer: Self-pay | Admitting: Podiatry

## 2023-09-15 ENCOUNTER — Other Ambulatory Visit (HOSPITAL_BASED_OUTPATIENT_CLINIC_OR_DEPARTMENT_OTHER): Payer: Self-pay | Admitting: Family Medicine

## 2023-09-15 DIAGNOSIS — I10 Essential (primary) hypertension: Secondary | ICD-10-CM

## 2023-09-15 MED ORDER — AMLODIPINE BESYLATE 2.5 MG PO TABS
2.5000 mg | ORAL_TABLET | Freq: Every day | ORAL | 3 refills | Status: AC
Start: 2023-09-15 — End: ?

## 2023-09-16 ENCOUNTER — Ambulatory Visit: Payer: Self-pay | Admitting: Podiatry

## 2024-08-21 ENCOUNTER — Ambulatory Visit: Payer: Self-pay | Admitting: Family Medicine
